# Patient Record
Sex: Male | Born: 1950 | Race: White | Hispanic: No | Marital: Married
Health system: Southern US, Community
[De-identification: ages and names within clinical notes are randomized; demographics above are authoritative.]

---

## 2003-08-17 ENCOUNTER — Ambulatory Visit (HOSPITAL_BASED_OUTPATIENT_CLINIC_OR_DEPARTMENT_OTHER): Admission: RE | Admit: 2003-08-17 | Discharge: 2003-08-17 | Payer: Self-pay | Admitting: Surgery

## 2003-08-17 ENCOUNTER — Ambulatory Visit (HOSPITAL_COMMUNITY): Admission: RE | Admit: 2003-08-17 | Discharge: 2003-08-17 | Payer: Self-pay | Admitting: Surgery

## 2006-01-31 ENCOUNTER — Ambulatory Visit (HOSPITAL_COMMUNITY): Admission: RE | Admit: 2006-01-31 | Discharge: 2006-01-31 | Payer: Self-pay | Admitting: Internal Medicine

## 2006-02-03 ENCOUNTER — Encounter: Admission: RE | Admit: 2006-02-03 | Discharge: 2006-02-03 | Payer: Self-pay | Admitting: Internal Medicine

## 2006-02-05 ENCOUNTER — Encounter: Admission: RE | Admit: 2006-02-05 | Discharge: 2006-02-05 | Payer: Self-pay | Admitting: Internal Medicine

## 2008-06-05 IMAGING — CT CT ABDOMEN W/O CM
1 series · 15 of 32 positions shown, 19 images · non-contrast
Comparison: None.

CLINICAL DATA: Left flank pain for one week. Hematuria and nausea.
 ABDOMEN CT WITHOUT CONTRAST (CT UROGRAM):
TECHNIQUE: Multidetector CT imaging of the abdomen was performed following the standard protocol without IV contrast.
TECHNIQUE: Multidetector CT imaging of the pelvis was performed following the standard protocol without IV contrast.

[Series 2: stone_wo 5.0 b40f st · axial · 0.84mm/px · z∈[-476,-116]mm · 15 of 101 slices shown, 19 images]
[im 7/101  soft-tissue]
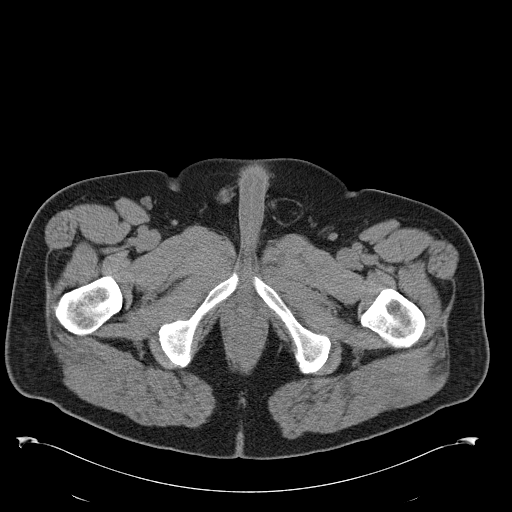
[im 7/101  bone]
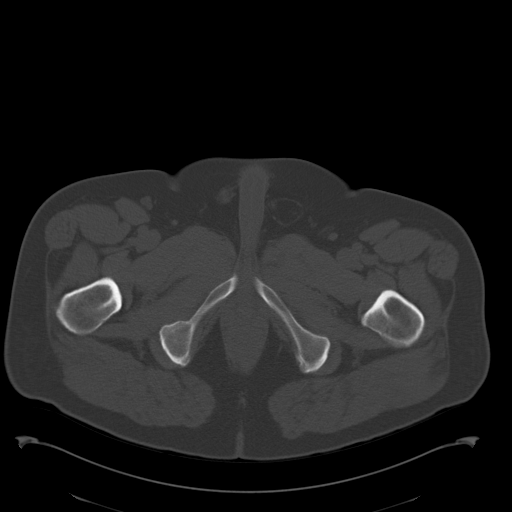
[im 13/101  soft-tissue]
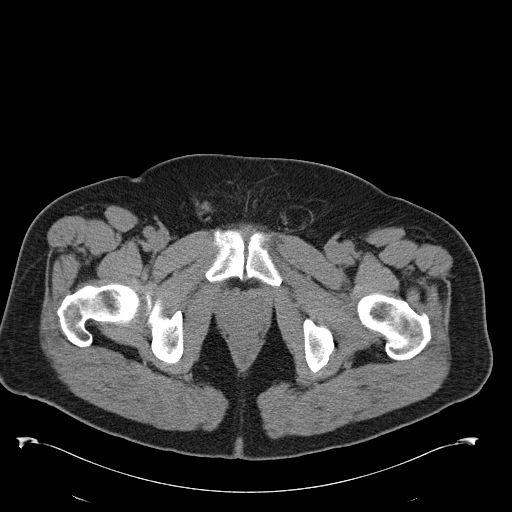
[im 20/101  soft-tissue]
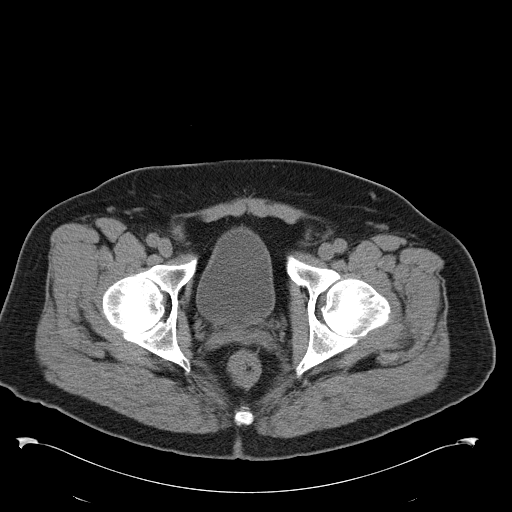
[im 30/101  soft-tissue]
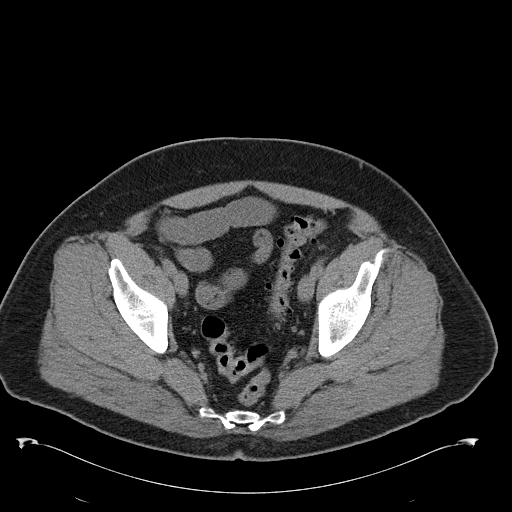
[im 36/101  soft-tissue]
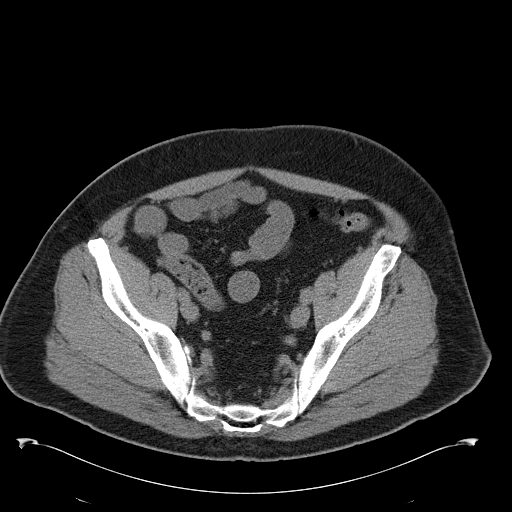
[im 42/101  soft-tissue]
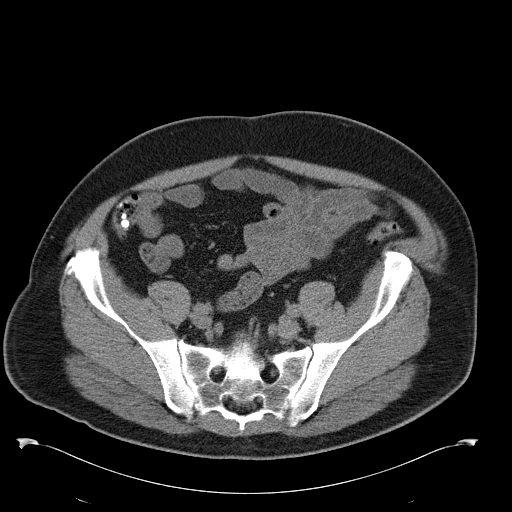
[im 52/101  soft-tissue]
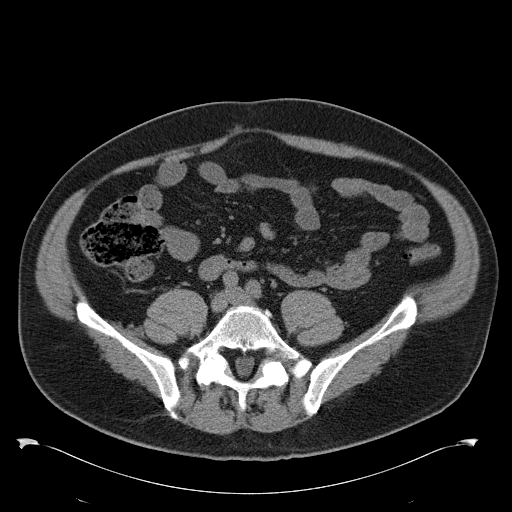
[im 59/101  soft-tissue]
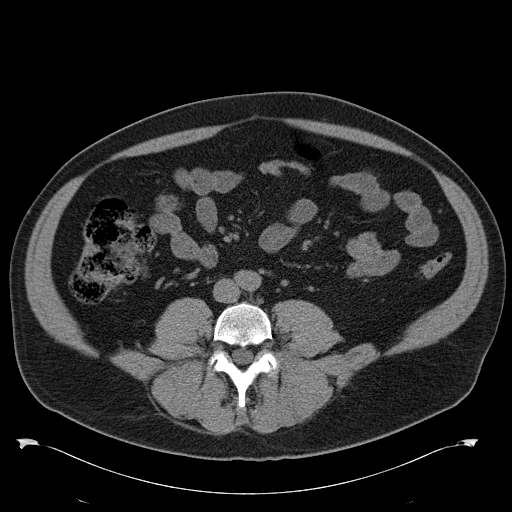
[im 65/101  soft-tissue]
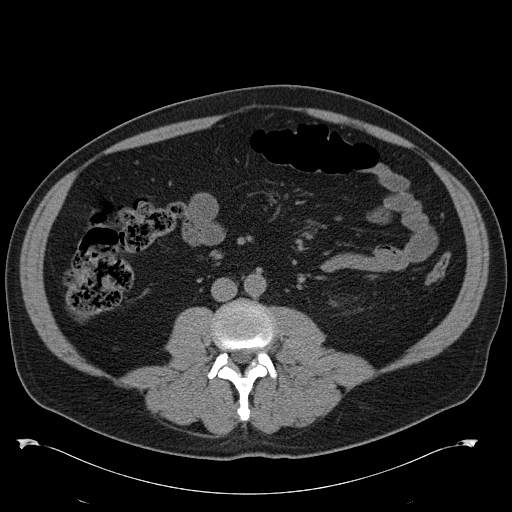
[im 65/101  bone]
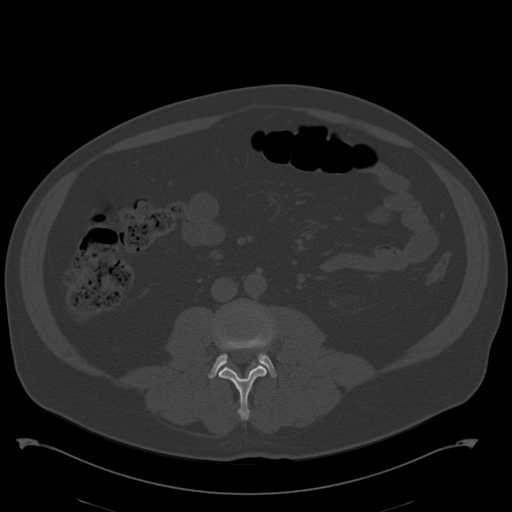
[im 71/101  soft-tissue]
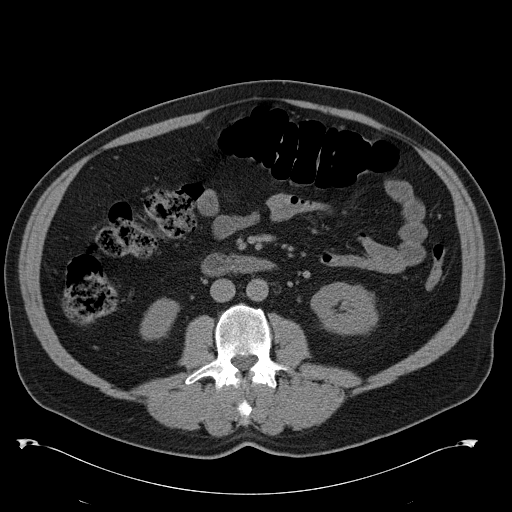
[im 81/101  soft-tissue]
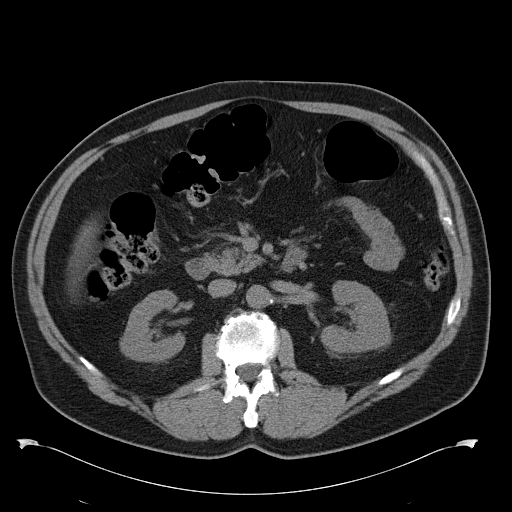
[im 88/101  soft-tissue]
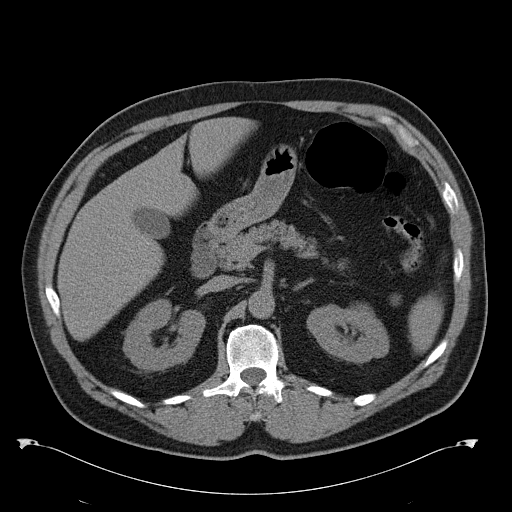
[im 88/101  lung]
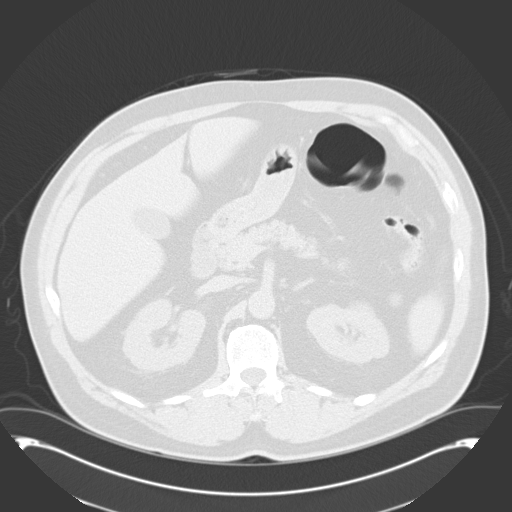
[im 91/101  lung]
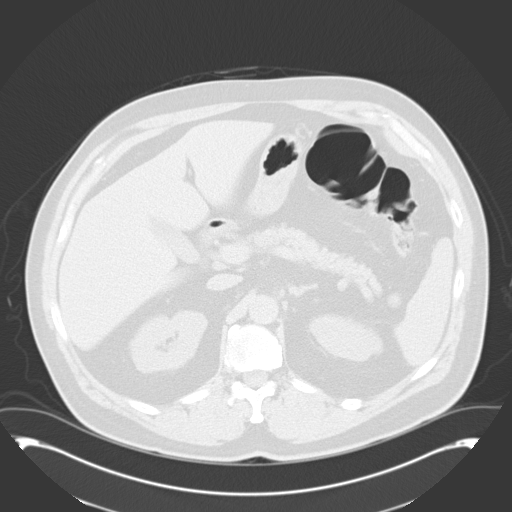
[im 94/101  soft-tissue]
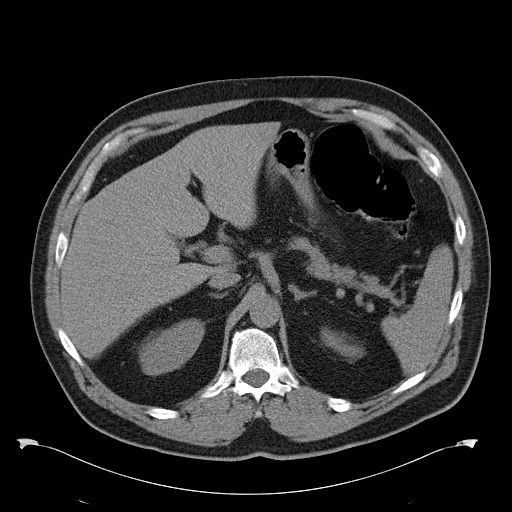
[im 94/101  lung]
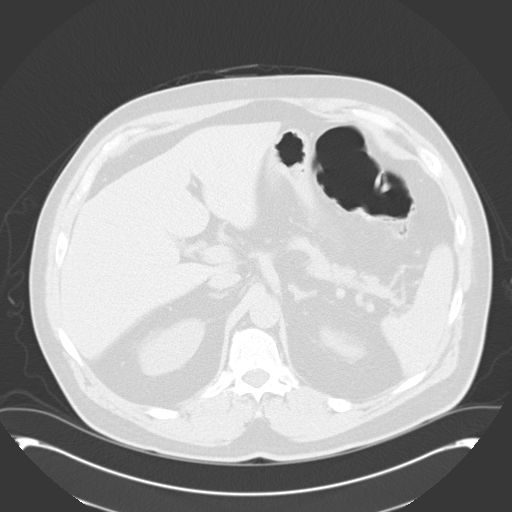
[im 97/101  lung]
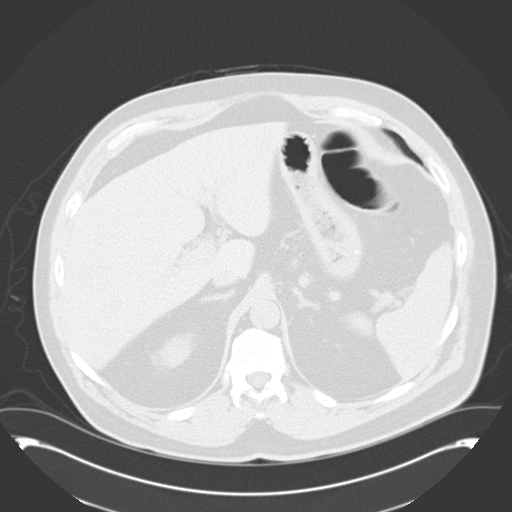

[15 of 32 positions shown; findings below may reference images not displayed]

FINDINGS: Limited images through the upper abdomen were obtained. The visualized portions of the liver parenchyma are normal. The gallbladder is negative. The pancreas is normal. The visualized portion of the spleen is normal. There is a small soft tissue nodule adjacent to the spleen, which likely represents a splenule. The visualized portions of the adrenal glands are normal. The right kidney is normal. There is a low-density arising from the lower pole of the right kidney, which measures 1.5 cm.  This is best seen on the coronal images and this likely represents a simple cyst, but is incompletely characterized without IV contrast media.  There is no right-sided hydronephrosis, nephrolithiasis, hydroureter, or ureterolithiasis. 
 The left kidney is normal. There is no evidence for nephrolithiasis or hydronephrosis. There is no left-sided hydroureter or ureterolithiasis. There is no evidence for bowel obstruction and the small bowel loops are normal in course and caliber.
 The large bowel loops have a mild to moderate amount of retained stool.
 There are no enlarged retroperitoneal or small bowel mesenteric lymph nodes. 
 There are no abnormal fluid collections or masses. 
 Review of the bone windows shows lumbar spondylosis. 
 There is degenerative disc disease at L1-L2.
IMPRESSION: 1.  No evidence for nephrolithiasis or hydronephrosis. No hydroureter or ureterolithiasis. 
 2.  Lumbar degenerative disc disease.
 PELVIS CT WITHOUT CONTRAST (CT UROGRAM):
FINDINGS: There is sigmoid diverticulosis without evidence for active diverticulitis.  There is a small fat-containing left inguinal hernia. No free fluid or pelvic masses. No pelvic adenopathy or inguinal adenopathy is noted. Review of the bone windows shows facet degenerative changes.
IMPRESSION: No acute pelvic CT findings.

## 2012-08-27 ENCOUNTER — Other Ambulatory Visit (HOSPITAL_COMMUNITY): Payer: Self-pay | Admitting: Urology

## 2012-08-27 DIAGNOSIS — Z8042 Family history of malignant neoplasm of prostate: Secondary | ICD-10-CM

## 2012-08-27 DIAGNOSIS — N402 Nodular prostate without lower urinary tract symptoms: Secondary | ICD-10-CM

## 2012-09-10 ENCOUNTER — Ambulatory Visit (HOSPITAL_COMMUNITY)
Admission: RE | Admit: 2012-09-10 | Discharge: 2012-09-10 | Disposition: A | Discharge status: Home / Self Care | Payer: BC Managed Care – PPO | Source: Ambulatory Visit | Attending: Urology | Admitting: Urology

## 2013-03-03 ENCOUNTER — Other Ambulatory Visit (HOSPITAL_COMMUNITY): Payer: BC Managed Care – PPO

## 2013-03-03 ENCOUNTER — Ambulatory Visit (HOSPITAL_COMMUNITY): Admission: RE | Admit: 2013-03-03 | Payer: BC Managed Care – PPO | Source: Ambulatory Visit

## 2013-03-03 ENCOUNTER — Other Ambulatory Visit (HOSPITAL_COMMUNITY): Payer: Self-pay | Admitting: Urology

## 2013-03-03 ENCOUNTER — Ambulatory Visit (HOSPITAL_COMMUNITY)
Admission: RE | Admit: 2013-03-03 | Discharge: 2013-03-03 | Disposition: A | Discharge status: Home / Self Care | Payer: BC Managed Care – PPO | Source: Ambulatory Visit | Attending: Urology | Admitting: Urology

## 2013-03-03 DIAGNOSIS — K5730 Diverticulosis of large intestine without perforation or abscess without bleeding: Secondary | ICD-10-CM | POA: Insufficient documentation

## 2013-03-03 DIAGNOSIS — N402 Nodular prostate without lower urinary tract symptoms: Secondary | ICD-10-CM

## 2013-03-03 LAB — POCT I-STAT CREATININE: CREATININE: 1 mg/dL (ref 0.50–1.35)

## 2013-03-03 MED ORDER — GADOBENATE DIMEGLUMINE 529 MG/ML IV SOLN
20.0000 mL | Freq: Once | INTRAVENOUS | Status: AC | PRN
  Administered 2013-03-03: 20 mL via INTRAVENOUS
